# Patient Record
Sex: Female | Born: 2000
Health system: Southern US, Community
[De-identification: ages and names within clinical notes are randomized; demographics above are authoritative.]

## PROBLEM LIST (undated history)

## (undated) ENCOUNTER — Emergency Department (HOSPITAL_COMMUNITY): Discharge status: Absent without leave | Payer: 59

## (undated) DIAGNOSIS — Z803 Family history of malignant neoplasm of breast: Secondary | ICD-10-CM

## (undated) DIAGNOSIS — Z91018 Allergy to other foods: Secondary | ICD-10-CM

## (undated) DIAGNOSIS — J302 Other seasonal allergic rhinitis: Secondary | ICD-10-CM

## (undated) HISTORY — DX: Family history of malignant neoplasm of breast: Z80.3

## (undated) HISTORY — DX: Allergy to other foods: Z91.018

## (undated) HISTORY — DX: Other seasonal allergic rhinitis: J30.2

---
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
## 2020-10-15 DIAGNOSIS — Z0000 Encounter for general adult medical examination without abnormal findings: Secondary | ICD-10-CM | POA: Diagnosis not present

## 2020-10-15 DIAGNOSIS — Z131 Encounter for screening for diabetes mellitus: Secondary | ICD-10-CM | POA: Diagnosis not present

## 2020-10-15 DIAGNOSIS — Z23 Encounter for immunization: Secondary | ICD-10-CM | POA: Diagnosis not present

## 2020-10-15 DIAGNOSIS — Z13220 Encounter for screening for lipoid disorders: Secondary | ICD-10-CM | POA: Diagnosis not present

## 2020-12-15 ENCOUNTER — Encounter (HOSPITAL_BASED_OUTPATIENT_CLINIC_OR_DEPARTMENT_OTHER): Payer: Self-pay | Admitting: Obstetrics & Gynecology

## 2020-12-15 ENCOUNTER — Other Ambulatory Visit: Payer: Self-pay

## 2020-12-15 ENCOUNTER — Other Ambulatory Visit (HOSPITAL_COMMUNITY)
Admission: RE | Admit: 2020-12-15 | Discharge: 2020-12-15 | Disposition: A | Discharge status: Home / Self Care | Payer: BC Managed Care – PPO | Source: Ambulatory Visit | Attending: Obstetrics & Gynecology | Admitting: Obstetrics & Gynecology

## 2020-12-15 ENCOUNTER — Ambulatory Visit (INDEPENDENT_AMBULATORY_CARE_PROVIDER_SITE_OTHER): Payer: BC Managed Care – PPO | Admitting: Obstetrics & Gynecology

## 2020-12-15 VITALS — BP 104/73 | HR 74 | Ht 64.0 in | Wt 155.0 lb

## 2020-12-15 DIAGNOSIS — Z01419 Encounter for gynecological examination (general) (routine) without abnormal findings: Secondary | ICD-10-CM

## 2020-12-15 DIAGNOSIS — Z87898 Personal history of other specified conditions: Secondary | ICD-10-CM

## 2020-12-15 DIAGNOSIS — Z8481 Family history of carrier of genetic disease: Secondary | ICD-10-CM | POA: Diagnosis not present

## 2020-12-15 DIAGNOSIS — Z113 Encounter for screening for infections with a predominantly sexual mode of transmission: Secondary | ICD-10-CM | POA: Diagnosis not present

## 2020-12-15 DIAGNOSIS — Z8742 Personal history of other diseases of the female genital tract: Secondary | ICD-10-CM

## 2020-12-15 NOTE — Progress Notes (Addendum)
20 y.o. G0P0000 Single Other or two or more races female here for annual exam.  Has a Kyleena IUD.  Cycles are monthly.  First day is heavy.  Feels over the past few months that her bleeding is getting back to where it was.  She felt the bleeding was improved until the past few months and her cramping is back to the way is was.  Would like to possibly have new IUD placed.      Sexually active: Yes.    The current method of family planning is IUD.     The pregnancy intention screening data noted above was reviewed. Potential methods of contraception were discussed. The patient elected to proceed with No data recorded.  Exercising: Yes.     walking Smoker:  no  Health Maintenance: Pap:  not indicated MMG:  not indicated Screening Labs: declines blood work today   reports that she has never smoked. She has never used smokeless tobacco. She reports that she does not drink alcohol and does not use drugs.  Past Medical History:  Diagnosis Date   Food allergy    egg   Seasonal allergies     No past surgical history on file.  Current Outpatient Medications  Medication Sig Dispense Refill   Levonorgestrel (KYLEENA) 19.5 MG IUD by Intrauterine route.     Multiple Vitamin (MULTIVITAMIN) tablet Take 1 tablet by mouth daily.     No current facility-administered medications for this visit.    Family History  Problem Relation Age of Onset   Kidney disease Paternal Grandmother    Endometriosis Mother    BRCA 1/2 Mother        brca 1 positive, bilateral mastectomy at age 62   Diabetes Father    Polycystic ovary syndrome Sister    Obesity Sister        had gastric bipass   Lupus Sister    Obesity Maternal Aunt    Diabetes Maternal Aunt    Hypertension Maternal Aunt    Thyroid disease Maternal Aunt    Obesity Maternal Uncle    Diabetes Maternal Uncle    Hypertension Maternal Uncle    Thyroid disease Maternal Uncle    Diabetes Paternal Uncle    Diabetes Cousin     Review of  Systems  All other systems reviewed and are negative.  Exam:   BP 104/73 (BP Location: Left Arm, Patient Position: Sitting, Cuff Size: Normal)   Pulse 74   Ht 5' 4" (1.626 m) Comment: reported  Wt 155 lb (70.3 kg)   LMP 12/06/2020   BMI 26.61 kg/m   Height: 5' 4" (162.6 cm) (reported)  General appearance: alert, cooperative and appears stated age Head: Normocephalic, without obvious abnormality, atraumatic Neck: no adenopathy, supple, symmetrical, trachea midline and thyroid normal to inspection and palpation Lungs: clear to auscultation bilaterally Breasts: normal appearance, no masses or tenderness Heart: regular rate and rhythm Abdomen: soft, non-tender; bowel sounds normal; no masses,  no organomegaly Extremities: extremities normal, atraumatic, no cyanosis or edema Skin: Skin color, texture, turgor normal. No rashes or lesions Lymph nodes: Cervical, supraclavicular, and axillary nodes normal. No abnormal inguinal nodes palpated Neurologic: Grossly normal   Pelvic: External genitalia:  no lesions              Urethra:  normal appearing urethra with no masses, tenderness or lesions              Bartholins and Skenes: normal  Vagina: normal appearing vagina with normal color and no discharge, no lesions              Cervix: no lesions              Pap taken: No. Bimanual Exam:  Uterus:  normal size, contour, position, consistency, mobility, non-tender              Adnexa: normal adnexa and no mass, fullness, tenderness               Rectovaginal: Confirms               Anus:  normal sphincter tone, no lesions  Chaperone, Tonya Sheldon, CMA, was present for exam.  Assessment/Plan: 1. Well woman exam with routine gynecological exam - pap not indicated - MMG/MRI screening discussed  2. Screening for STD (sexually transmitted disease) - Cervicovaginal ancillary only( [place])  3. Family history of BRCA gene mutation - Ambulatory referral to  Genetics  4. History of prediabetes - recent labs with Dr. Lisa Miller normal per pt  5. History of menorrhagia/continued need for contraception - pt desires changing to Mirena IUD if possible    

## 2020-12-17 ENCOUNTER — Telehealth: Payer: Self-pay | Admitting: Genetic Counselor

## 2020-12-17 LAB — CERVICOVAGINAL ANCILLARY ONLY
Chlamydia: NEGATIVE
Comment: NEGATIVE
Comment: NORMAL
Neisseria Gonorrhea: NEGATIVE

## 2020-12-17 NOTE — Telephone Encounter (Signed)
Scheduled appt per 11/14 referral. Pt is aware of appt date and time.  

## 2020-12-23 ENCOUNTER — Other Ambulatory Visit: Payer: Self-pay

## 2020-12-23 ENCOUNTER — Ambulatory Visit (INDEPENDENT_AMBULATORY_CARE_PROVIDER_SITE_OTHER): Payer: BC Managed Care – PPO | Admitting: Obstetrics & Gynecology

## 2020-12-23 DIAGNOSIS — Z30430 Encounter for insertion of intrauterine contraceptive device: Secondary | ICD-10-CM

## 2020-12-23 DIAGNOSIS — Z30433 Encounter for removal and reinsertion of intrauterine contraceptive device: Secondary | ICD-10-CM | POA: Diagnosis not present

## 2020-12-23 MED ORDER — LEVONORGESTREL 20 MCG/DAY IU IUD
1.0000 | INTRAUTERINE_SYSTEM | Freq: Once | INTRAUTERINE | Status: AC
  Administered 2020-12-23: 1 via INTRAUTERINE

## 2020-12-23 NOTE — Progress Notes (Signed)
20 y.o. G46P0000 Single Hispanic female presents for removal of Kyleena IUD and re-insertion of IUD.  She is planning on using Mirena IUD.  Pt has been counseled about alternative forms of contraception and feels IUD is the better option for her.  Pt has also been counseled about risks and benefits as well as complications.  Consent is obtained today.  All questions answered prior to start of procedure.   Recent STD testing:  12/15/2020--negative LMP:  Patient's last menstrual period was 12/06/2020.  Patient Active Problem List   Diagnosis Date Noted   Prediabetes 02/22/2013   Acanthosis 02/22/2013   Anovulatory cycle 02/22/2013   Past Medical History:  Diagnosis Date   Food allergy    egg   Seasonal allergies    Current Outpatient Medications on File Prior to Visit  Medication Sig Dispense Refill   Levonorgestrel (KYLEENA) 19.5 MG IUD by Intrauterine route.     Multiple Vitamin (MULTIVITAMIN) tablet Take 1 tablet by mouth daily.     No current facility-administered medications on file prior to visit.   Eggs or egg-derived products  Review of Systems  All other systems reviewed and are negative. There were no vitals filed for this visit.  Gen:  WNWF healthy female NAD Abdomen: soft, non-tender Groin:  no inguinal nodes palpated  Pelvic exam: Vulva:  normal female genitalia Vagina:  normal vagina Cervix:  Non-tender, Negative CMT, no lesions or redness. Uterus:  normal shape, position and consistency   Procedure:  Speculum reinserted.  Cervix visualized and cleansed with Betadine x 3.  IUD string noted and grasped with ringed forcep.  With one pull, IUD removed easily.  Pt tolerated this well.  Then uterus sounded to 7cm.  Mirena IUD and introducer passed to fundus and then withdrawn slightly before IUD was passed into endometrial cavity.  Introducer removed.  Strings cut to 2cm.  Tenaculum removed from cervix.  Minimal bleeding noted.  Pt tolerated the procedure well.  All  instruments removed from vagina.  Assessment/Plan: 1. Encounter for removal and reinsertion of intrauterine contraceptive device (IUD) - recheck 6 weeks

## 2020-12-26 ENCOUNTER — Encounter: Payer: Self-pay | Admitting: Genetic Counselor

## 2020-12-30 ENCOUNTER — Inpatient Hospital Stay: Payer: BC Managed Care – PPO

## 2020-12-30 ENCOUNTER — Encounter: Payer: Self-pay | Admitting: Genetic Counselor

## 2020-12-30 ENCOUNTER — Inpatient Hospital Stay: Payer: BC Managed Care – PPO | Attending: Genetic Counselor | Admitting: Genetic Counselor

## 2020-12-30 ENCOUNTER — Other Ambulatory Visit: Payer: Self-pay

## 2020-12-30 DIAGNOSIS — Z803 Family history of malignant neoplasm of breast: Secondary | ICD-10-CM

## 2020-12-30 DIAGNOSIS — Z8041 Family history of malignant neoplasm of ovary: Secondary | ICD-10-CM | POA: Insufficient documentation

## 2020-12-30 DIAGNOSIS — Z8481 Family history of carrier of genetic disease: Secondary | ICD-10-CM

## 2020-12-30 LAB — GENETIC SCREENING ORDER

## 2020-12-30 NOTE — Progress Notes (Signed)
REFERRING PROVIDER: Megan Salon, MD [address] [place], [place] [address]  PRIMARY PROVIDER:  Kathyrn Lass, MD  PRIMARY REASON FOR VISIT:  1. Family history of BRCA gene mutation   2. Family history of breast cancer   3. Family history of ovarian cancer     HISTORY OF PRESENT ILLNESS:   Jenny Snyder, a 20 y.o. female, was seen for a [place] cancer genetics consultation at the request of Dr. Sabra Heck due to a personal and family history of cancer.  Jenny Snyder presents to clinic today to discuss the possibility of a hereditary predisposition to cancer, to discuss genetic testing, and to further clarify her future cancer risks, as well as potential cancer risks for family members.   Jenny Snyder is a 20 y.o. female with no personal history of cancer.    Past Medical History:  Diagnosis Date   Food allergy    egg   Seasonal allergies     No past surgical history on file.  Social History   Socioeconomic History   Marital status: Single    Spouse name: Not on file   Number of children: Not on file   Years of education: Not on file   Highest education level: Not on file  Occupational History   Not on file  Tobacco Use   Smoking status: Never   Smokeless tobacco: Never  Vaping Use   Vaping Use: Never used  Substance and Sexual Activity   Alcohol use: No   Drug use: No   Sexual activity: Not Currently    Birth control/protection: I.U.D.  Other Topics Concern   Not on file  Social History Narrative   Lives with parents   Social Determinants of Health   Financial Resource Strain: Not on file  Food Insecurity: Not on file  Transportation Needs: Not on file  Physical Activity: Not on file  Stress: Not on file  Social Connections: Not on file     FAMILY HISTORY:  We obtained a detailed, 4-generation family history.  Significant diagnoses are listed below: Family History  Problem Relation Age of Onset   BRCA 1/2 Mother        brca  positive, bilateral mastectomy at age 42   Breast cancer Mother 62   Breast cancer Maternal Aunt    Ovarian cancer Maternal Aunt    Breast cancer Maternal Aunt    Breast cancer Maternal Aunt    Breast cancer Maternal Aunt    Breast cancer Maternal Aunt    Colon cancer Paternal Aunt 42   Throat cancer Paternal Grandfather        Jenny Snyder's mother was found to have a BRCA2 mutation around age 68 and shortly thereafter had a TAH/BSO. She was later diagnosed with breast cancer at age 49 and had a double mastectomy. Of note, we do not have her mother's report to confirm the gene/pathogenic variant. Jenny Snyder has 7 maternal aunts, 28 of which were diagnosed with breast cancer in their 30s/40s, all 5 are deceased. One of the aunts who had a history of breast cancer was also diagnosed with ovarian cancer.  Jenny Snyder paternal aunt was diagnosed with colon cancer at age 92 and died due to colon cancer at age 46. Her paternal grandfather was diagnosed with throat cancer, he smoked, he is deceased. There is no reported Ashkenazi Jewish ancestry.   GENETIC COUNSELING ASSESSMENT: Jenny Snyder is a 20 y.o. female with a family history of a BRCA2 mutation in her  mother. We, therefore, discussed and recommended the following at today's visit.   DISCUSSION:  We reviewed the cancer risks and management recommendations for individuals with a BRCA2 mutation with a focus on management in females.   The cancers associated with BRCA2 are: Female breast cancer, up to an 84% risk In women with a history of breast cancer, the cumulative risk for contralateral breast cancer 5 years after breast cancer diagnosis is 9%. The cumulative 20 year risk is approximately 26%. Female breast cancer, up to an 8% risk Ovarian cancer, 17-27% risk Pancreatic cancer, up to a 7% risk Melanoma, elevated risk   Management Recommendations:  Breast Screening/Risk Reduction:  Women: Breast cancer  screening includes: Breast awareness beginning at age 45 Monthly self-breast examination beginning at age 65 Clinical breast examination every 6-12 months beginning at age 50 or at the age of the earliest diagnosed breast cancer in the family, if onset was before age 80 Annual breast MRI with contrast beginning at age 62-29 (or annual mammograms with consideration of tomosynthesis if MRI is unavailable), although the age to initiate screening may be individualized based on family history Annual mammogram beginning at age 33 until age 2 with consideration of tomosynthesis with continuation of annual breast MRI with contrast The option of prophylactic bilateral risk-reducing mastectomy (RRM), removal of the breast tissue before cancer develops, is the best option for significantly decreasing the risk of developing breast cancer. Studies have shown mastectomies reduce the risk of breast cancer by 90-95% in women with a BRCA2 mutation. Breast reconstruction can also be performed immediately following the mastectomy, depending on the type of reconstruction chosen.   Gynecological Cancer Screening/Risk Reduction: It is recommended that women with a BRCA2 mutation consider having a risk-reducing salpingo oophorectomy (RRSO), removal of the ovaries and fallopian tubes, between the ages of 61-45 or once childbearing is completed. Having a RRSO is estimated to reduce the risk of ovarian cancer by up to 96%. There is still a small risk of developing an "ovarian-like" cancer in the lining of the abdomen, called the peritoneum. Another benefit to having the ovaries removed is the risk reduction for breast cancer. If the ovaries are removed before menopause, the risk of developing breast cancer is reduced. Women undergoing a RRSO should be aware of the potential risks and benefits of concurrent hysterectomy. Hormone replacement therapy could be considered based on the physician's discretion. Ovarian cancer screening  is an option for women who chose not to have a RRSO or who, as of yet, have not completed their family. Current screening methods for ovarian cancer are neither sensitive nor specific, meaning that often early stage ovarian cancer cannot be diagnosed through this screening.  Screening can also be falsely positive with no cancer present. For this reason, ovarian cancer screening is not recommended for women beyond their child-bearing years. If ovarian cancer screening is recommended by your physician, it could include: CA-125 blood tests Transvaginal ultrasounds Clinical pelvic exams   Skin Cancer Screening and Risk Reduction: Regular skin self-examinations Individuals should notify their physicians of any changes to moles such as increasing in size, darkening in color, or other change in appearance. Annual skin examinations by a dermatologist  Follow sun-safety recommendations such as: Using UVA and UVB 30 SPF or higher sunscreen Avoiding sunburns Limiting sun exposure, especially during the hours of 11am-4pm  Wearing protective clothing and sunglasses Avoid using tanning beds For more information about the prevention of melanoma visit [email or web address]   Pancreatic Cancer Screening/Risk Reduction: It  has been suggested that pancreatic cancer screening be limited to those with a family history of pancreatic cancer (first- or second-degree relative). Ideally, screening should be performed in experienced centers utilizing a multidisciplinary approach under research conditions. Recommended screening could include annual endoscopic ultrasound (preferred) and/or MRI of the pancreas starting at age 24 or 71 years younger than the earliest age diagnosis in the family.  Implications for Family Members: Hereditary predisposition to cancer due to pathogenic variants in the BRCA2 gene has autosomal dominant inheritance. This means that an individual with a pathogenic variant has a 50% chance of passing  the condition on to his/her offspring.   Additional Information: Based on Jenny Snyder's family history of cancer/her mother's BRCA2 mutation, she meets medical criteria for genetic testing. Despite that she meets criteria, she may still have an out of pocket cost. We discussed that if her out of pocket cost for testing is over $100, the laboratory will call and confirm whether she wants to proceed with testing.  If the out of pocket cost of testing is less than $100 she will be billed by the genetic testing laboratory.   We discussed that some people do not want to undergo genetic testing due to fear of genetic discrimination.  A federal law called the Genetic Information Non-Discrimination Act (GINA) of 2008 helps protect individuals against genetic discrimination based on their genetic test results.  It impacts both health insurance and employment.  With health insurance, it protects against increased premiums, being kicked off insurance or being forced to take a test in order to be insured.  For employment it protects against hiring, firing and promoting decisions based on genetic test results.  GINA does not apply to those in the [employer], those who work for companies with less than 15 employees, and new life insurance or long-term disability insurance policies.  Health status due to a cancer diagnosis is not protected under GINA.  We discussed three options for genetic testing. First, we could test BRCA2 only. However, without her mother's genetic testing results, I am reluctant to only order only BRCA2. Second, we discussed ordering BRCA1 and BRCA2. Third, we discussed ordering a larger panel of genes due to her paternal family history of cancer and lack of a familial test report. After considering the benefits and limitations of each option, she elected to have [facility] CancerNext Panel+RNA.  The CancerNext gene panel offered by [facility] includes sequencing, rearrangement analysis, and  RNA analysis for the following 36 genes:   APC, ATM, AXIN2, BARD1, BMPR1A, BRCA1, BRCA2, BRIP1, CDH1, CDK4, CDKN2A, CHEK2, DICER1, HOXB13, EPCAM, GREM1, MLH1, MSH2, MSH3, MSH6, MUTYH, NBN, NF1, NTHL1, PALB2, PMS2, POLD1, POLE, PTEN, RAD51C, RAD51D, RECQL, SMAD4, SMARCA4, STK11, and TP53.    PLAN: After considering the risks, benefits, and limitations, Jenny Snyder provided informed consent to pursue genetic testing and the blood sample was sent to [facility] for analysis of the CancerNext Panel. Results should be available within approximately 2-3 weeks' time, at which point they will be disclosed by telephone to Jenny Snyder, as will any additional recommendations warranted by these results. Ms. Dragon will receive a summary of her genetic counseling visit and a copy of her results once available. This information will also be available in Epic.   Jenny Snyder questions were answered to her satisfaction today. Our contact information was provided should additional questions or concerns arise. Thank you for the referral and allowing [place] to share in the care of your patient.   Jenny Snyder  Jenny Snyder, [place], [facility] Genetic Counselor [email or web address] (P) [phone]  The patient was seen for a total of 25 minutes in face-to-face genetic counseling. The patient was seen alone.  Drs. Magrinat, Lindi Adie and/or Burr Medico were available to discuss this case as needed.  _______________________________________________________________________ For Office Staff:  Number of people involved in session: 1 Was an Intern/ student involved with case: no

## 2021-01-01 DIAGNOSIS — Z1371 Encounter for nonprocreative screening for genetic disease carrier status: Secondary | ICD-10-CM

## 2021-01-01 HISTORY — DX: Encounter for nonprocreative screening for genetic disease carrier status: Z13.71

## 2021-01-16 ENCOUNTER — Telehealth: Payer: Self-pay | Admitting: Genetic Counselor

## 2021-01-16 ENCOUNTER — Encounter: Payer: Self-pay | Admitting: Genetic Counselor

## 2021-01-16 DIAGNOSIS — Z1371 Encounter for nonprocreative screening for genetic disease carrier status: Secondary | ICD-10-CM | POA: Insufficient documentation

## 2021-01-16 DIAGNOSIS — Z1379 Encounter for other screening for genetic and chromosomal anomalies: Secondary | ICD-10-CM | POA: Insufficient documentation

## 2021-01-16 NOTE — Telephone Encounter (Signed)
I contacted Ms. Santiago-Perez to discuss her genetic testing results. No pathogenic variants were identified in the 36 genes analyzed including BRCA1/2. Detailed clinic note to follow.  The test report has been scanned into EPIC and is located under the Molecular Pathology section of the Results Review tab.  A portion of the result report is included below for reference.   Lucille Passy, MS, [facility] Genetic Counselor [email or web address] (P) [phone]

## 2021-01-19 ENCOUNTER — Ambulatory Visit: Payer: Self-pay | Admitting: Genetic Counselor

## 2021-01-19 DIAGNOSIS — Z1371 Encounter for nonprocreative screening for genetic disease carrier status: Secondary | ICD-10-CM

## 2021-01-19 DIAGNOSIS — Z8481 Family history of carrier of genetic disease: Secondary | ICD-10-CM

## 2021-01-19 DIAGNOSIS — Z1379 Encounter for other screening for genetic and chromosomal anomalies: Secondary | ICD-10-CM

## 2021-01-19 NOTE — Progress Notes (Signed)
HPI:   [name] was previously seen in the [place] clinic due to a family history of a BRCA2 mutation and concerns regarding a hereditary predisposition to cancer. Please refer to our prior cancer genetics clinic note for more information regarding our discussion, assessment and recommendations, at the time. [name] recent genetic test results were disclosed to her, as were recommendations warranted by these results. These results and recommendations are discussed in more detail below.  CANCER HISTORY:  Oncology History   No history exists.    FAMILY HISTORY:  We obtained a detailed, 4-generation family history.  Significant diagnoses are listed below:      Family History  Problem Relation Age of Onset   BRCA 1/2 Mother          brca positive, bilateral mastectomy at age 41   Breast cancer Mother 33   Breast cancer Maternal Aunt     Ovarian cancer Maternal Aunt     Breast cancer Maternal Aunt     Breast cancer Maternal Aunt     Breast cancer Maternal Aunt     Breast cancer Maternal Aunt     Colon cancer Paternal Aunt 11   Throat cancer Paternal Grandfather            [name]'s mother was found to have a BRCA2 mutation around age 39 and shortly thereafter had a TAH/BSO. She was later diagnosed with breast cancer at age 90 and had a double mastectomy. Of note, we do not have her mother's report to confirm the gene/pathogenic variant. [name] has 7 maternal aunts, 72 of which were diagnosed with breast cancer in their 30s/40s, all 5 are deceased. One of the aunts who had a history of breast cancer was also diagnosed with ovarian cancer.   [name] paternal aunt was diagnosed with colon cancer at age 51 and died due to colon cancer at age 39. Her paternal grandfather was diagnosed with throat cancer, he smoked, he is deceased. There is no reported Ashkenazi Jewish ancestry.   GENETIC TEST RESULTS:  The [facility]  CancerNext Panel was Negative. The genetic testing included the BRCA2 gene.  The CancerNext gene panel offered by [facility] includes sequencing, rearrangement analysis, and RNA analysis for the following 36 genes:   APC, ATM, AXIN2, BARD1, BMPR1A, BRCA1, BRCA2, BRIP1, CDH1, CDK4, CDKN2A, CHEK2, DICER1, HOXB13, EPCAM, GREM1, MLH1, MSH2, MSH3, MSH6, MUTYH, NBN, NF1, NTHL1, PALB2, PMS2, POLD1, POLE, PTEN, RAD51C, RAD51D, RECQL, SMAD4, SMARCA4, STK11, and TP53.     The test report has been scanned into EPIC and is located under the Molecular Pathology section of the Results Review tab.  A portion of the result report is included below for reference. Genetic testing reported out on 01/12/2021.       We call this result a true negative result because the cancer-causing mutation was identified in [name]'s family, and she did not inherit it.  Given this negative result, [name]'s chances of developing BRCA2-related cancers are the same as they are in the general population.    ADDITIONAL GENETIC TESTING:  We discussed with [name] that her genetic testing was fairly extensive.  If there are genes identified to increase cancer risk that can be analyzed in the future, we would be happy to discuss and coordinate this testing at that time.    CANCER SCREENING RECOMMENDATIONS:  [name] chances of developing BRCA2-related cancers are the same as they are in the general population. Therefore,  we recommend she have general population screening for cancer including annual mammograms starting at age 62 and colonoscopies every 10 years beginning at age 67.   Our contact number was provided. [name] questions were answered to her satisfaction, and she knows she is welcome to call us at anytime with additional questions or concerns.   Lucille Passy, MS, [facility] Genetic Counselor [email or web address] (P) [phone]

## 2021-02-09 ENCOUNTER — Other Ambulatory Visit: Payer: Self-pay

## 2021-02-09 ENCOUNTER — Encounter (HOSPITAL_BASED_OUTPATIENT_CLINIC_OR_DEPARTMENT_OTHER): Payer: Self-pay | Admitting: Obstetrics & Gynecology

## 2021-02-09 ENCOUNTER — Ambulatory Visit (INDEPENDENT_AMBULATORY_CARE_PROVIDER_SITE_OTHER): Payer: BC Managed Care – PPO | Admitting: Obstetrics & Gynecology

## 2021-02-09 VITALS — BP 113/78 | HR 90 | Ht 64.0 in | Wt 157.4 lb

## 2021-02-09 DIAGNOSIS — Z30431 Encounter for routine checking of intrauterine contraceptive device: Secondary | ICD-10-CM

## 2021-02-09 DIAGNOSIS — L309 Dermatitis, unspecified: Secondary | ICD-10-CM | POA: Insufficient documentation

## 2021-02-09 DIAGNOSIS — N943 Premenstrual tension syndrome: Secondary | ICD-10-CM

## 2021-02-09 MED ORDER — NORETHIN ACE-ETH ESTRAD-FE 1-20 MG-MCG PO TABS: 1.0000 | ORAL_TABLET | Freq: Every day | ORAL | 5 refills | Status: DC

## 2021-02-09 NOTE — Progress Notes (Signed)
21 y.o. G27P0000 Single Hispanic female presents for followed up after insertion of Mirena on 12/23/2020.  Pt reports she is doing well.  She complains of  hormonal related complaints and PMS symptoms.  Did have genetic testing and this was all negative.  Interested in starting OCPs.  Does not want IUD removed at this time.   Denies painful intercourse.  States significant other can feel string.  LMP:  Patient's last menstrual period was 01/26/2021.  Patient Active Problem List   Diagnosis Date Noted   Genetic testing 01/16/2021   BRCA negative 01/16/2021   Family history of BRCA gene mutation 12/30/2020   Family history of breast cancer 12/30/2020   Family history of ovarian cancer 12/30/2020   Prediabetes 02/22/2013   Acanthosis 02/22/2013   Anovulatory cycle 02/22/2013   Past Medical History:  Diagnosis Date   Food allergy    egg   Seasonal allergies    Current Outpatient Medications on File Prior to Visit  Medication Sig Dispense Refill   cholecalciferol (VITAMIN D3) 25 MCG (1000 UNIT) tablet Take 2,000 Units by mouth daily.     levonorgestrel (MIRENA) 20 MCG/DAY IUD 1 each by Intrauterine route once.     Multiple Vitamin (MULTIVITAMIN) tablet Take 1 tablet by mouth daily. (Patient not taking: Reported on 02/09/2021)     No current facility-administered medications on file prior to visit.   Eggs or egg-derived products  Review of Systems  All other systems reviewed and are negative. Vitals:   02/09/21 1129  BP: 113/78  Pulse: 90  Weight: 157 lb 6.4 oz (71.4 kg)  Height: '5\' 4"'  (1.626 m)    Gen:  WNWF healthy female NAD Abdomen: soft, non-tender Groin:  no inguinal nodes palpated  Pelvic exam: Vulva:  normal female genitalia Vagina:  normal vagina Cervix:  Non-tender, Negative CMT, no lesions or redness.  IUD string noted.  About 1/2cm trimmed.   Uterus:  normal shape, position and consistency    Assessment/Plan: 1. IUD check up - strings trimmed from IUD  today  2. PMS (premenstrual syndrome) - due to additional hormonal symptoms and negative genetic testing, feel starting OCPs reasonable.  Risks discussed with pt in detail including DUB, DVT/PE, headache, nausea, increased BP.   - norethindrone-ethinyl estradiol-FE (LOESTRIN FE) 1-20 MG-MCG tablet; Take 1 tablet by mouth daily.  Dispense: 28 tablet; Refill: 5 - will need BP check in 3 months

## 2021-04-09 ENCOUNTER — Other Ambulatory Visit: Payer: Self-pay

## 2021-04-09 ENCOUNTER — Ambulatory Visit (INDEPENDENT_AMBULATORY_CARE_PROVIDER_SITE_OTHER): Payer: BC Managed Care – PPO | Admitting: *Deleted

## 2021-04-09 ENCOUNTER — Encounter (HOSPITAL_BASED_OUTPATIENT_CLINIC_OR_DEPARTMENT_OTHER): Payer: Self-pay

## 2021-04-09 VITALS — BP 112/75 | HR 66

## 2021-04-09 DIAGNOSIS — Z0130 Encounter for examination of blood pressure without abnormal findings: Secondary | ICD-10-CM

## 2021-04-09 NOTE — Progress Notes (Signed)
Pt here for BP check after being started on oral contraceptives 2 months ago. BP WNL. Advised to call with any problems or concerns. ?

## 2021-06-02 DIAGNOSIS — J029 Acute pharyngitis, unspecified: Secondary | ICD-10-CM | POA: Diagnosis not present

## 2021-06-02 DIAGNOSIS — Z20828 Contact with and (suspected) exposure to other viral communicable diseases: Secondary | ICD-10-CM | POA: Diagnosis not present

## 2021-06-02 DIAGNOSIS — R5383 Other fatigue: Secondary | ICD-10-CM | POA: Diagnosis not present

## 2021-06-11 ENCOUNTER — Ambulatory Visit (INDEPENDENT_AMBULATORY_CARE_PROVIDER_SITE_OTHER): Payer: BC Managed Care – PPO | Admitting: Obstetrics & Gynecology

## 2021-06-11 ENCOUNTER — Encounter (HOSPITAL_BASED_OUTPATIENT_CLINIC_OR_DEPARTMENT_OTHER): Payer: Self-pay | Admitting: Obstetrics & Gynecology

## 2021-06-11 VITALS — BP 106/70 | HR 70 | Ht 64.0 in | Wt 160.4 lb

## 2021-06-11 DIAGNOSIS — Z975 Presence of (intrauterine) contraceptive device: Secondary | ICD-10-CM | POA: Diagnosis not present

## 2021-06-11 DIAGNOSIS — Z803 Family history of malignant neoplasm of breast: Secondary | ICD-10-CM | POA: Diagnosis not present

## 2021-06-11 DIAGNOSIS — N943 Premenstrual tension syndrome: Secondary | ICD-10-CM

## 2021-06-11 DIAGNOSIS — Z1371 Encounter for nonprocreative screening for genetic disease carrier status: Secondary | ICD-10-CM

## 2021-06-11 DIAGNOSIS — Z01419 Encounter for gynecological examination (general) (routine) without abnormal findings: Secondary | ICD-10-CM | POA: Diagnosis not present

## 2021-06-11 DIAGNOSIS — Z8481 Family history of carrier of genetic disease: Secondary | ICD-10-CM

## 2021-06-11 MED ORDER — NORETHIN ACE-ETH ESTRAD-FE 1-20 MG-MCG PO TABS: 1.0000 | ORAL_TABLET | Freq: Every day | ORAL | 3 refills | Status: DC

## 2021-06-11 NOTE — Progress Notes (Signed)
21 y.o. G0P0000 Single Other or two or more races female here for annual exam.  Had IUD placed in December.  Due to PMS symptoms, Lo estrin was started.  Is feeling good with current regimen.  Bleeding is very minimal.  Having minimal cramping.  Denies pain with intercourse.  Denies vaginal discharge.  STD screening was in November.  Wants to continue with pills.   ? ?Mother has hx of breast cancer and BRCA 2 gene positive results.  Pt had testing in December.  Her testing was all negative.  Recommendation for screening MMG is now the same as the general population.   ? ?No LMP recorded.          ?Sexually active: Yes.    ?The current method of family planning is IUD.    ?Exercising: no but she is acitve ?Smoker:  no ? ?Health Maintenance: ?Pap:  due age 72 ?Screening Labs: done 07/2018 ? ? reports that she has never smoked. She has never used smokeless tobacco. She reports that she does not drink alcohol and does not use drugs. ? ?Past Medical History:  ?Diagnosis Date  ? BRCA gene mutation negative 01/2021  ? Family history of breast cancer   ? Food allergy   ? egg  ? Seasonal allergies   ? ? ?No past surgical history on file. ? ?Current Outpatient Medications  ?Medication Sig Dispense Refill  ? cholecalciferol (VITAMIN D3) 25 MCG (1000 UNIT) tablet Take 2,000 Units by mouth daily.    ? levonorgestrel (MIRENA) 20 MCG/DAY IUD 1 each by Intrauterine route once.    ? Multiple Vitamin (MULTIVITAMIN) tablet Take 1 tablet by mouth daily. (Patient not taking: Reported on 02/09/2021)    ? norethindrone-ethinyl estradiol-FE (LOESTRIN FE) 1-20 MG-MCG tablet Take 1 tablet by mouth daily. 84 tablet 3  ? ?No current facility-administered medications for this visit.  ? ? ?Family History  ?Problem Relation Age of Onset  ? Endometriosis Mother   ? BRCA 1/2 Mother   ?     brca positive, bilateral mastectomy at age 21  ? Breast cancer Mother 29  ? Diabetes Father   ? Polycystic ovary syndrome Sister   ? Obesity Sister   ?     had  gastric bipass  ? Lupus Sister   ? Obesity Maternal Aunt   ? Diabetes Maternal Aunt   ? Hypertension Maternal Aunt   ? Thyroid disease Maternal Aunt   ? Breast cancer Maternal Aunt   ? Ovarian cancer Maternal Aunt   ? Breast cancer Maternal Aunt   ? Breast cancer Maternal Aunt   ? Breast cancer Maternal Aunt   ? Breast cancer Maternal Aunt   ? Obesity Maternal Uncle   ? Diabetes Maternal Uncle   ? Hypertension Maternal Uncle   ? Thyroid disease Maternal Uncle   ? Colon cancer Paternal Aunt 71  ? Diabetes Paternal Uncle   ? Kidney disease Paternal Grandmother   ? Throat cancer Paternal Grandfather   ? Diabetes Cousin   ? ? ?Genitourinary:negative ? ?Exam:   ?BP 106/70 (BP Location: Right Arm, Patient Position: Sitting, Cuff Size: Normal)   Pulse 70   Ht 5' 4" (1.626 m) Comment: Reported  Wt 160 lb 6.4 oz (72.8 kg)   BMI 27.53 kg/m?   Height: 5' 4" (162.6 cm) (Reported) ? ?General appearance: alert, cooperative and appears stated age ?Head: Normocephalic, without obvious abnormality, atraumatic ?Neck: no adenopathy, supple, symmetrical, trachea midline and thyroid normal to inspection and palpation ?  Lungs: clear to auscultation bilaterally ?Heart: regular rate and rhythm ? ?Assessment/Plan: ?1. Well woman exam with routine gynecological exam ?- plan pap smear with next AEX.  Guidelines reviewed. ?- MMG now recommended at age 59 as genetic testing was negative ?- screening lab work recommended at age 33 ?- vaccines reviewed/updated ?- STI screening 12/2020 ? ?2. PMS (premenstrual syndrome) ?- norethindrone-ethinyl estradiol-FE (LOESTRIN FE) 1-20 MG-MCG tablet; Take 1 tablet by mouth daily.  Dispense: 84 tablet; Refill: 3 ? ?3. IUD (intrauterine device) in place ?- Mirena placed 12/23/2020 ? ?4. Family history of breast cancer ? ?5. Family history of BRCA gene mutation ? ? ?6. BRCA negative ? ?

## 2021-06-25 DIAGNOSIS — J029 Acute pharyngitis, unspecified: Secondary | ICD-10-CM | POA: Diagnosis not present

## 2021-06-25 DIAGNOSIS — R0981 Nasal congestion: Secondary | ICD-10-CM | POA: Diagnosis not present

## 2021-07-05 DIAGNOSIS — R059 Cough, unspecified: Secondary | ICD-10-CM | POA: Diagnosis not present

## 2021-07-05 DIAGNOSIS — R0981 Nasal congestion: Secondary | ICD-10-CM | POA: Diagnosis not present

## 2021-07-05 DIAGNOSIS — J019 Acute sinusitis, unspecified: Secondary | ICD-10-CM | POA: Diagnosis not present

## 2021-08-07 DIAGNOSIS — M79601 Pain in right arm: Secondary | ICD-10-CM | POA: Diagnosis not present

## 2021-08-07 DIAGNOSIS — T22011A Burn of unspecified degree of right forearm, initial encounter: Secondary | ICD-10-CM | POA: Diagnosis not present

## 2021-10-16 ENCOUNTER — Telehealth (HOSPITAL_BASED_OUTPATIENT_CLINIC_OR_DEPARTMENT_OTHER): Payer: Self-pay | Admitting: Obstetrics & Gynecology

## 2021-10-16 ENCOUNTER — Encounter (HOSPITAL_BASED_OUTPATIENT_CLINIC_OR_DEPARTMENT_OTHER): Payer: Self-pay | Admitting: Obstetrics & Gynecology

## 2021-10-16 ENCOUNTER — Ambulatory Visit (INDEPENDENT_AMBULATORY_CARE_PROVIDER_SITE_OTHER): Payer: BC Managed Care – PPO | Admitting: Obstetrics & Gynecology

## 2021-10-16 ENCOUNTER — Ambulatory Visit (HOSPITAL_BASED_OUTPATIENT_CLINIC_OR_DEPARTMENT_OTHER)
Admission: RE | Admit: 2021-10-16 | Discharge: 2021-10-16 | Disposition: A | Discharge status: Home / Self Care | Payer: BC Managed Care – PPO | Source: Ambulatory Visit | Attending: Obstetrics & Gynecology | Admitting: Obstetrics & Gynecology

## 2021-10-16 VITALS — BP 113/83 | HR 72 | Resp 14

## 2021-10-16 DIAGNOSIS — R102 Pelvic and perineal pain: Secondary | ICD-10-CM | POA: Insufficient documentation

## 2021-10-16 DIAGNOSIS — K5900 Constipation, unspecified: Secondary | ICD-10-CM | POA: Diagnosis not present

## 2021-10-16 DIAGNOSIS — N83202 Unspecified ovarian cyst, left side: Secondary | ICD-10-CM | POA: Diagnosis not present

## 2021-10-16 LAB — POCT URINALYSIS DIPSTICK
Appearance: NORMAL
Bilirubin, UA: NEGATIVE
Blood, UA: NEGATIVE
Glucose, UA: NEGATIVE
Ketones, UA: NEGATIVE
Leukocytes, UA: NEGATIVE
Nitrite, UA: NEGATIVE
Protein, UA: NEGATIVE
Spec Grav, UA: 1.025 (ref 1.010–1.025)
Urobilinogen, UA: 0.2 E.U./dL
pH, UA: 6 (ref 5.0–8.0)

## 2021-10-16 MED ORDER — GABAPENTIN 100 MG PO CAPS: 100.0000 mg | ORAL_CAPSULE | Freq: Three times a day (TID) | ORAL | 0 refills | Status: DC | PRN

## 2021-10-16 NOTE — Telephone Encounter (Signed)
Patient  called and said she having really bad cramps and would to be seen today .

## 2021-10-16 NOTE — Progress Notes (Unsigned)
GYNECOLOGY  VISIT  CC:   pelvic pain  HPI: 21 y.o. G0P0000 Single Other or two or more races female here for complaint of pain that she has been experiencing over the past month.  Reports she's been having some issues with pain with bowel movements.  She does have chronic constipation.  She is also noticing increased pain when her bladder is fully.  She is also noticing some pain with intercourse.  She is with the same partner since last year.  Has STD testing in November.  Last menstrual cycle was around 09/20/2021.  Flow then lasted 5 days.  Flow is lighter since having the IUD placed.     Past Medical History:  Diagnosis Date   BRCA gene mutation negative 01/2021   Family history of breast cancer    Food allergy    egg   Seasonal allergies     MEDS:   Current Outpatient Medications on File Prior to Visit  Medication Sig Dispense Refill   cholecalciferol (VITAMIN D3) 25 MCG (1000 UNIT) tablet Take 2,000 Units by mouth daily.     levonorgestrel (MIRENA) 20 MCG/DAY IUD 1 each by Intrauterine route once.     Multiple Vitamin (MULTIVITAMIN) tablet Take 1 tablet by mouth daily.     norethindrone-ethinyl estradiol-FE (LOESTRIN FE) 1-20 MG-MCG tablet Take 1 tablet by mouth daily. 84 tablet 3   No current facility-administered medications on file prior to visit.    ALLERGIES: Eggs or egg-derived products  SH:  single, non smoker  Review of Systems  Constitutional: Negative.   Genitourinary:        Pelvic pain    PHYSICAL EXAMINATION:    BP 113/83   Pulse 72   Resp 14     General appearance: alert, cooperative and appears stated age Abdomen: soft, mild LLQ tenderness, no rebound or guarding, bowel sounds normal; no masses,  no organomegaly Lymph:  no inguinal LAD noted  Pelvic: External genitalia:  no lesions              Urethra:  normal appearing urethra with no masses, tenderness or lesions              Bartholins and Skenes: normal                 Vagina: normal  appearing vagina with normal color and discharge, no lesions              Cervix: no lesions              Bimanual Exam:  Uterus:  normal size, contour, position, consistency, mobility, non-tender and no CMT              Adnexa:  fullness and tenderness in left adnexa  Chaperone, Octaviano Batty, CMA, was present for exam.  Assessment/Plan: 1. Pelvic pain - pt will have ultrasound done today.  Called radiology and they will work her into schedule at 1pm - US PELVIC COMPLETE WITH TRANSVAGINAL; Future - POCT urinalysis dipstick  2. Left adnexal tenderness  3. Constipation, unspecified constipation type - will have pt start colace 168m bid

## 2021-10-19 ENCOUNTER — Encounter (HOSPITAL_BASED_OUTPATIENT_CLINIC_OR_DEPARTMENT_OTHER): Payer: Self-pay | Admitting: Obstetrics & Gynecology

## 2021-10-20 DIAGNOSIS — Z13220 Encounter for screening for lipoid disorders: Secondary | ICD-10-CM | POA: Diagnosis not present

## 2021-10-20 DIAGNOSIS — L309 Dermatitis, unspecified: Secondary | ICD-10-CM | POA: Diagnosis not present

## 2021-10-20 DIAGNOSIS — Z0000 Encounter for general adult medical examination without abnormal findings: Secondary | ICD-10-CM | POA: Diagnosis not present

## 2021-10-20 DIAGNOSIS — N920 Excessive and frequent menstruation with regular cycle: Secondary | ICD-10-CM | POA: Diagnosis not present

## 2021-10-20 DIAGNOSIS — J4520 Mild intermittent asthma, uncomplicated: Secondary | ICD-10-CM | POA: Diagnosis not present

## 2021-10-20 DIAGNOSIS — Z23 Encounter for immunization: Secondary | ICD-10-CM | POA: Diagnosis not present

## 2021-10-20 NOTE — Telephone Encounter (Signed)
Pt presents to office with c/o cramping. Pt provided with appt

## 2022-01-19 DIAGNOSIS — R5383 Other fatigue: Secondary | ICD-10-CM | POA: Diagnosis not present

## 2022-01-19 DIAGNOSIS — U071 COVID-19: Secondary | ICD-10-CM | POA: Diagnosis not present

## 2022-06-17 ENCOUNTER — Other Ambulatory Visit (HOSPITAL_BASED_OUTPATIENT_CLINIC_OR_DEPARTMENT_OTHER): Payer: Self-pay | Admitting: Obstetrics & Gynecology

## 2022-06-17 DIAGNOSIS — N943 Premenstrual tension syndrome: Secondary | ICD-10-CM

## 2022-06-18 ENCOUNTER — Encounter (HOSPITAL_BASED_OUTPATIENT_CLINIC_OR_DEPARTMENT_OTHER): Payer: Self-pay | Admitting: Obstetrics & Gynecology

## 2022-06-18 ENCOUNTER — Other Ambulatory Visit (HOSPITAL_COMMUNITY)
Admission: RE | Admit: 2022-06-18 | Discharge: 2022-06-18 | Disposition: A | Discharge status: Home / Self Care | Payer: BC Managed Care – PPO | Source: Ambulatory Visit | Attending: Obstetrics & Gynecology | Admitting: Obstetrics & Gynecology

## 2022-06-18 ENCOUNTER — Ambulatory Visit (INDEPENDENT_AMBULATORY_CARE_PROVIDER_SITE_OTHER): Payer: BC Managed Care – PPO | Admitting: Obstetrics & Gynecology

## 2022-06-18 VITALS — BP 119/78 | HR 78 | Ht 64.0 in | Wt 160.0 lb

## 2022-06-18 DIAGNOSIS — Z124 Encounter for screening for malignant neoplasm of cervix: Secondary | ICD-10-CM

## 2022-06-18 DIAGNOSIS — Z8481 Family history of carrier of genetic disease: Secondary | ICD-10-CM

## 2022-06-18 DIAGNOSIS — Z113 Encounter for screening for infections with a predominantly sexual mode of transmission: Secondary | ICD-10-CM | POA: Diagnosis not present

## 2022-06-18 DIAGNOSIS — Z803 Family history of malignant neoplasm of breast: Secondary | ICD-10-CM

## 2022-06-18 DIAGNOSIS — Z23 Encounter for immunization: Secondary | ICD-10-CM

## 2022-06-18 DIAGNOSIS — Z01419 Encounter for gynecological examination (general) (routine) without abnormal findings: Secondary | ICD-10-CM | POA: Diagnosis not present

## 2022-06-18 DIAGNOSIS — Z8041 Family history of malignant neoplasm of ovary: Secondary | ICD-10-CM

## 2022-06-18 DIAGNOSIS — Z1371 Encounter for nonprocreative screening for genetic disease carrier status: Secondary | ICD-10-CM

## 2022-06-18 NOTE — Progress Notes (Signed)
22 y.o. G0P0000 Single Other or two or more races female here for annual exam.  Has IUD.  Had a lot of spotting/irregular bleeding with placement.  Is on OCPs now but would like to stop.  Bleeding is regular but very light.  No LMP recorded. (Menstrual status: Oral contraceptives).          Sexually active: Yes.    The current method of family planning is IUD.     Upstream - 06/18/22 1610       Pregnancy Intention Screening   Does the patient want to become pregnant in the next year? No    Does the patient's partner want to become pregnant in the next year? No    Would the patient like to discuss contraceptive options today? No      Contraception Wrap Up   Current Method IUD or IUS            Exercising: Yes.    Smoker:  yes  Health Maintenance: Pap:  today MMG:  guidelines discussed.  Will start at age 30. Colonoscopy:  guidelines reviewed Screening Labs: STI testing   reports that she has never smoked. She has never used smokeless tobacco. She reports that she does not drink alcohol and does not use drugs.  Past Medical History:  Diagnosis Date   BRCA gene mutation negative 01/2021   Family history of breast cancer    Food allergy    egg   Seasonal allergies     History reviewed. No pertinent surgical history.  Current Outpatient Medications  Medication Sig Dispense Refill   levonorgestrel (MIRENA) 20 MCG/DAY IUD 1 each by Intrauterine route once.     norethindrone-ethinyl estradiol-FE (LOESTRIN FE) 1-20 MG-MCG tablet Take 1 tablet by mouth daily. 84 tablet 3   No current facility-administered medications for this visit.    Family History  Problem Relation Age of Onset   Endometriosis Mother    BRCA 1/2 Mother        brca positive, bilateral mastectomy at age 76   Breast cancer Mother 2   Diabetes Father    Polycystic ovary syndrome Sister    Obesity Sister        had gastric bipass   Lupus Sister    Obesity Maternal Aunt    Diabetes Maternal Aunt     Hypertension Maternal Aunt    Thyroid disease Maternal Aunt    Breast cancer Maternal Aunt    Ovarian cancer Maternal Aunt    Breast cancer Maternal Aunt    Breast cancer Maternal Aunt    Breast cancer Maternal Aunt    Breast cancer Maternal Aunt    Obesity Maternal Uncle    Diabetes Maternal Uncle    Hypertension Maternal Uncle    Thyroid disease Maternal Uncle    Colon cancer Paternal Aunt 28   Diabetes Paternal Uncle    Kidney disease Paternal Grandmother    Throat cancer Paternal Grandfather    Diabetes Cousin     ROS: Constitutional: negative Genitourinary:negative  Exam:   BP 119/78   Pulse 78   Ht 5\' 4"  (1.626 m)   Wt 160 lb (72.6 kg)   BMI 27.46 kg/m   Height: 5\' 4"  (162.6 cm)  General appearance: alert, cooperative and appears stated age Head: Normocephalic, without obvious abnormality, atraumatic Neck: no adenopathy, supple, symmetrical, trachea midline and thyroid normal to inspection and palpation Lungs: clear to auscultation bilaterally Breasts: normal appearance, no masses or tenderness Heart: regular rate and  rhythm Abdomen: soft, non-tender; bowel sounds normal; no masses,  no organomegaly Extremities: extremities normal, atraumatic, no cyanosis or edema Skin: Skin color, texture, turgor normal. No rashes or lesions Lymph nodes: Cervical, supraclavicular, and axillary nodes normal. No abnormal inguinal nodes palpated Neurologic: Grossly normal   Pelvic: External genitalia:  no lesions              Urethra:  normal appearing urethra with no masses, tenderness or lesions              Bartholins and Skenes: normal                 Vagina: normal appearing vagina with normal color and no discharge, no lesions              Cervix: no lesions, IUD string noted              Pap taken: Yes.   Bimanual Exam:  Uterus:  normal size, contour, position, consistency, mobility, non-tender              Adnexa: normal adnexa and no mass, fullness, tenderness                Rectovaginal: Confirms               Anus:  normal sphincter tone, no lesions  Chaperone, Beola Cord, CMA, was present for exam.  Assessment/Plan: 1. Well woman exam with routine gynecological exam .- Pap smear obtained today - Mammogram guideliens reviewed considering her family hx - Colonoscopy guidelines reviewed - vaccines reviewed/updated.  Update tdap today.  2. Cervical cancer screening - Cytology - PAP( )  3. Family history of breast cancer  4. Screening examination for STD (sexually transmitted disease) - GC/Chl/trich testing obtained today - RPR+HBsAg+HIV - Hepatitis C antibody  5. Family history of BRCA gene mutation  6. BRCA negative - pt's testing was all negative  7. Family history of ovarian cancer  8.  IUD in place - pt will stop OCPs and let me know about bleeding over the next few months.

## 2022-06-19 LAB — RPR+HBSAG+HIV
HIV Screen 4th Generation wRfx: NONREACTIVE
Hepatitis B Surface Ag: NEGATIVE
RPR Ser Ql: NONREACTIVE

## 2022-06-19 LAB — HEPATITIS C ANTIBODY: Hep C Virus Ab: NONREACTIVE

## 2022-06-24 LAB — CYTOLOGY - PAP
Chlamydia: NEGATIVE
Comment: NEGATIVE
Comment: NEGATIVE
Comment: NORMAL
Diagnosis: NEGATIVE
Neisseria Gonorrhea: NEGATIVE
Trichomonas: NEGATIVE

## 2022-07-27 DIAGNOSIS — R52 Pain, unspecified: Secondary | ICD-10-CM | POA: Diagnosis not present

## 2022-07-27 DIAGNOSIS — R509 Fever, unspecified: Secondary | ICD-10-CM | POA: Diagnosis not present

## 2022-07-27 DIAGNOSIS — R051 Acute cough: Secondary | ICD-10-CM | POA: Diagnosis not present

## 2022-07-27 DIAGNOSIS — R5383 Other fatigue: Secondary | ICD-10-CM | POA: Diagnosis not present

## 2022-07-27 DIAGNOSIS — Z03818 Encounter for observation for suspected exposure to other biological agents ruled out: Secondary | ICD-10-CM | POA: Diagnosis not present

## 2022-07-29 DIAGNOSIS — R051 Acute cough: Secondary | ICD-10-CM | POA: Diagnosis not present

## 2022-07-29 DIAGNOSIS — J019 Acute sinusitis, unspecified: Secondary | ICD-10-CM | POA: Diagnosis not present

## 2022-07-29 DIAGNOSIS — R509 Fever, unspecified: Secondary | ICD-10-CM | POA: Diagnosis not present

## 2022-07-29 DIAGNOSIS — R0981 Nasal congestion: Secondary | ICD-10-CM | POA: Diagnosis not present

## 2022-08-03 DIAGNOSIS — J029 Acute pharyngitis, unspecified: Secondary | ICD-10-CM | POA: Diagnosis not present

## 2022-10-22 DIAGNOSIS — Z23 Encounter for immunization: Secondary | ICD-10-CM | POA: Diagnosis not present

## 2022-10-22 DIAGNOSIS — Z0000 Encounter for general adult medical examination without abnormal findings: Secondary | ICD-10-CM | POA: Diagnosis not present

## 2022-12-13 DIAGNOSIS — M2550 Pain in unspecified joint: Secondary | ICD-10-CM | POA: Diagnosis not present

## 2023-01-12 DIAGNOSIS — Z79899 Other long term (current) drug therapy: Secondary | ICD-10-CM | POA: Diagnosis not present

## 2023-01-12 DIAGNOSIS — R768 Other specified abnormal immunological findings in serum: Secondary | ICD-10-CM | POA: Diagnosis not present

## 2023-01-12 DIAGNOSIS — D8989 Other specified disorders involving the immune mechanism, not elsewhere classified: Secondary | ICD-10-CM | POA: Diagnosis not present

## 2023-02-07 DIAGNOSIS — R768 Other specified abnormal immunological findings in serum: Secondary | ICD-10-CM | POA: Diagnosis not present

## 2023-02-07 DIAGNOSIS — D8989 Other specified disorders involving the immune mechanism, not elsewhere classified: Secondary | ICD-10-CM | POA: Diagnosis not present

## 2023-02-28 DIAGNOSIS — N390 Urinary tract infection, site not specified: Secondary | ICD-10-CM | POA: Diagnosis not present

## 2023-03-08 DIAGNOSIS — J069 Acute upper respiratory infection, unspecified: Secondary | ICD-10-CM | POA: Diagnosis not present

## 2023-03-10 DIAGNOSIS — R399 Unspecified symptoms and signs involving the genitourinary system: Secondary | ICD-10-CM | POA: Diagnosis not present

## 2023-05-17 DIAGNOSIS — R599 Enlarged lymph nodes, unspecified: Secondary | ICD-10-CM | POA: Diagnosis not present

## 2023-05-17 DIAGNOSIS — Z6830 Body mass index (BMI) 30.0-30.9, adult: Secondary | ICD-10-CM | POA: Diagnosis not present

## 2023-05-30 DIAGNOSIS — R0981 Nasal congestion: Secondary | ICD-10-CM | POA: Diagnosis not present

## 2023-05-30 DIAGNOSIS — R051 Acute cough: Secondary | ICD-10-CM | POA: Diagnosis not present

## 2023-06-29 ENCOUNTER — Encounter (HOSPITAL_BASED_OUTPATIENT_CLINIC_OR_DEPARTMENT_OTHER): Payer: Self-pay | Admitting: Obstetrics & Gynecology

## 2023-06-29 ENCOUNTER — Ambulatory Visit (INDEPENDENT_AMBULATORY_CARE_PROVIDER_SITE_OTHER): Admitting: Obstetrics & Gynecology

## 2023-06-29 ENCOUNTER — Other Ambulatory Visit (HOSPITAL_COMMUNITY)
Admission: RE | Admit: 2023-06-29 | Discharge: 2023-06-29 | Disposition: A | Discharge status: Home / Self Care | Source: Ambulatory Visit | Attending: Obstetrics & Gynecology | Admitting: Obstetrics & Gynecology

## 2023-06-29 VITALS — BP 119/79 | HR 71 | Ht 64.0 in | Wt 180.4 lb

## 2023-06-29 DIAGNOSIS — Z1371 Encounter for nonprocreative screening for genetic disease carrier status: Secondary | ICD-10-CM

## 2023-06-29 DIAGNOSIS — Z01419 Encounter for gynecological examination (general) (routine) without abnormal findings: Secondary | ICD-10-CM

## 2023-06-29 DIAGNOSIS — Z113 Encounter for screening for infections with a predominantly sexual mode of transmission: Secondary | ICD-10-CM | POA: Insufficient documentation

## 2023-06-29 DIAGNOSIS — D8989 Other specified disorders involving the immune mechanism, not elsewhere classified: Secondary | ICD-10-CM | POA: Insufficient documentation

## 2023-06-29 DIAGNOSIS — Z8041 Family history of malignant neoplasm of ovary: Secondary | ICD-10-CM

## 2023-06-29 DIAGNOSIS — Z8481 Family history of carrier of genetic disease: Secondary | ICD-10-CM

## 2023-06-29 NOTE — Progress Notes (Signed)
   ANNUAL EXAM Patient name: Jenny Snyder MRN [id number]  Date of birth: 07-16-2000 Chief Complaint:   Annual Exam  History of Present Illness:   Jenny Snyder is a 23 y.o. G0P0000 female being seen today for a routine annual exam.  Has Mirena  IUD placed 12/2020.  Having minimal bleeding.     Patient's last menstrual period was 06/02/2023 (approximate).   Last pap 06/18/2022. Results were: negative. H/O abnormal pap: no Last mammogram: currently recommending starting at age 72 Last colonoscopy: screening guidelines reviewed     06/18/2022    9:21 AM 06/11/2021    3:33 PM 02/09/2021   11:28 AM 12/15/2020    4:29 PM  Depression screen PHQ 2/9  Decreased Interest 0 0 0 0  Down, Depressed, Hopeless 0 0 0 0  PHQ - 2 Score 0 0 0 0     Review of Systems:   Pertinent items are noted in HPI  Denies any vaginal discharge, urinary or bowel changes Pertinent History Reviewed:  Reviewed past medical,surgical, social and family history.  Reviewed problem list, medications and allergies. Physical Assessment:   Vitals:   06/29/23 1041  BP: 119/79  Pulse: 71  Weight: 180 lb 6.4 oz (81.8 kg)  Height: 5\' 4"  (1.626 m)  Body mass index is 30.97 kg/m.        Physical Examination:   General appearance - well appearing, and in no distress  Mental status - alert, oriented to person, place, and time  Psych:  She has a normal mood and affect  Skin - warm and dry, normal color, no suspicious lesions noted  Chest - effort normal, all lung fields clear to auscultation bilaterally  Heart - normal rate and regular rhythm  Neck:  midline trachea, no thyromegaly or nodules  Breasts - breasts appear normal, no suspicious masses, no skin or nipple changes or axillary nodes  Abdomen - soft, nontender, nondistended, no masses or organomegaly  Pelvic - VULVA: normal appearing vulva with no masses, tenderness or lesions   VAGINA: normal appearing vagina with normal color and  discharge, no lesions   CERVIX: normal appearing cervix without discharge or lesions, no CMT, IUD strings noted  Thin prep pap is not indicated today  UTERUS: uterus is felt to be normal size, shape, consistency and nontender   ADNEXA: No adnexal masses or tenderness noted.  Rectal - deferred  Extremities:  No swelling or varicosities noted  Chaperone present for exam Assessment & Plan:  1. Well woman exam with routine gynecological exam (Primary) - Pap smear neg 2024 - Mammogram guidelines discussed - Colonoscopy discussed screening at age 76 - lab work done with PCP, Dr. Perley Bradley - vaccines reviewed/updated  2. BRCA negative - testing 01/2021  3. Family history of BRCA gene mutation  4. Family history of ovarian cancer  5. Routine screening for STI (sexually transmitted infection) - GC/Chl obtained    Meds: No orders of the defined types were placed in this encounter.   Follow-up: Return in about 1 year (around 06/28/2024).  [name], MD 06/29/2023 11:41 AM

## 2023-06-30 LAB — CERVICOVAGINAL ANCILLARY ONLY
Chlamydia: NEGATIVE
Comment: NEGATIVE
Comment: NORMAL
Neisseria Gonorrhea: NEGATIVE

## 2023-07-01 ENCOUNTER — Ambulatory Visit (HOSPITAL_BASED_OUTPATIENT_CLINIC_OR_DEPARTMENT_OTHER): Payer: Self-pay | Admitting: Obstetrics & Gynecology

## 2023-07-01 ENCOUNTER — Ambulatory Visit (HOSPITAL_BASED_OUTPATIENT_CLINIC_OR_DEPARTMENT_OTHER): Payer: BC Managed Care – PPO | Admitting: Obstetrics & Gynecology

## 2023-07-27 DIAGNOSIS — Z6830 Body mass index (BMI) 30.0-30.9, adult: Secondary | ICD-10-CM | POA: Diagnosis not present

## 2023-07-27 DIAGNOSIS — J351 Hypertrophy of tonsils: Secondary | ICD-10-CM | POA: Diagnosis not present

## 2023-08-30 DIAGNOSIS — R221 Localized swelling, mass and lump, neck: Secondary | ICD-10-CM | POA: Diagnosis not present

## 2023-10-26 DIAGNOSIS — Z23 Encounter for immunization: Secondary | ICD-10-CM | POA: Diagnosis not present

## 2023-10-26 DIAGNOSIS — J4520 Mild intermittent asthma, uncomplicated: Secondary | ICD-10-CM | POA: Diagnosis not present

## 2023-10-26 DIAGNOSIS — L309 Dermatitis, unspecified: Secondary | ICD-10-CM | POA: Diagnosis not present

## 2023-10-26 DIAGNOSIS — Z0000 Encounter for general adult medical examination without abnormal findings: Secondary | ICD-10-CM | POA: Diagnosis not present

## 2023-10-26 DIAGNOSIS — N920 Excessive and frequent menstruation with regular cycle: Secondary | ICD-10-CM | POA: Diagnosis not present

## 2024-07-05 ENCOUNTER — Ambulatory Visit (HOSPITAL_BASED_OUTPATIENT_CLINIC_OR_DEPARTMENT_OTHER): Admitting: Obstetrics & Gynecology
